# Patient Record
Sex: Female | Born: 1960 | Race: Black or African American | Hispanic: No | Smoking: Never smoker
Health system: Southern US, Community
[De-identification: ages and names within clinical notes are randomized; demographics above are authoritative.]

## PROBLEM LIST (undated history)

## (undated) DIAGNOSIS — A4902 Methicillin resistant Staphylococcus aureus infection, unspecified site: Secondary | ICD-10-CM

## (undated) DIAGNOSIS — I10 Essential (primary) hypertension: Secondary | ICD-10-CM

## (undated) DIAGNOSIS — M1990 Unspecified osteoarthritis, unspecified site: Secondary | ICD-10-CM

## (undated) HISTORY — DX: Unspecified osteoarthritis, unspecified site: M19.90

## (undated) HISTORY — PX: REPLACEMENT TOTAL KNEE: SUR1224

## (undated) HISTORY — DX: Methicillin resistant Staphylococcus aureus infection, unspecified site: A49.02

---

## 2006-07-01 ENCOUNTER — Encounter: Admission: RE | Admit: 2006-07-01 | Discharge: 2006-07-01 | Payer: Self-pay | Admitting: Obstetrics & Gynecology

## 2007-08-18 ENCOUNTER — Encounter: Admission: RE | Admit: 2007-08-18 | Discharge: 2007-08-18 | Payer: Self-pay | Admitting: Obstetrics & Gynecology

## 2012-01-05 ENCOUNTER — Encounter (HOSPITAL_COMMUNITY): Payer: Self-pay

## 2012-01-05 ENCOUNTER — Emergency Department (HOSPITAL_COMMUNITY): Payer: Self-pay

## 2012-01-05 ENCOUNTER — Emergency Department (HOSPITAL_COMMUNITY)
Admission: EM | Admit: 2012-01-05 | Discharge: 2012-01-05 | Disposition: A | Discharge status: Home / Self Care | Payer: Self-pay | Attending: Emergency Medicine | Admitting: Emergency Medicine

## 2012-01-05 DIAGNOSIS — M7989 Other specified soft tissue disorders: Secondary | ICD-10-CM | POA: Insufficient documentation

## 2012-01-05 DIAGNOSIS — R609 Edema, unspecified: Secondary | ICD-10-CM | POA: Insufficient documentation

## 2012-01-05 DIAGNOSIS — R1032 Left lower quadrant pain: Secondary | ICD-10-CM | POA: Insufficient documentation

## 2012-01-05 LAB — COMPREHENSIVE METABOLIC PANEL
Albumin: 3.9 g/dL (ref 3.5–5.2)
BUN: 11 mg/dL (ref 6–23)
CO2: 28 mEq/L (ref 19–32)
Chloride: 103 mEq/L (ref 96–112)
GFR calc non Af Amer: >90 mL/min (ref >90)
Potassium: 4.1 mEq/L (ref 3.5–5.1)

## 2012-01-05 LAB — URINALYSIS, ROUTINE W REFLEX MICROSCOPIC
Bilirubin Urine: NEGATIVE
Glucose, UA: NEGATIVE mg/dL
Protein, ur: NEGATIVE mg/dL

## 2012-01-05 LAB — DIFFERENTIAL
Basophils Relative: 0 % (ref 0–1)
Eosinophils Relative: 1 % (ref 0–5)
Monocytes Relative: 6 % (ref 3–12)

## 2012-01-05 LAB — CBC
HCT: 36.3 % (ref 36.0–46.0)
Hemoglobin: 11.9 g/dL — ABNORMAL LOW (ref 12.0–15.0)
MCH: 29 pg (ref 26.0–34.0)
RBC: 4.11 MIL/uL (ref 3.87–5.11)
WBC: 12.6 10*3/uL — ABNORMAL HIGH (ref 4.0–10.5)

## 2012-01-05 LAB — URINE MICROSCOPIC-ADD ON

## 2012-01-05 LAB — PREGNANCY, URINE: Preg Test, Ur: NEGATIVE

## 2012-01-05 MED ORDER — PROMETHAZINE HCL 25 MG PO TABS: 25.0000 mg | ORAL_TABLET | Freq: Four times a day (QID) | ORAL | Status: DC | PRN

## 2012-01-05 MED ORDER — KETOROLAC TROMETHAMINE 60 MG/2ML IM SOLN
60.0000 mg | Freq: Once | INTRAMUSCULAR | Status: AC
  Administered 2012-01-05: 60 mg via INTRAMUSCULAR
  Filled 2012-01-05: qty 2

## 2012-01-05 MED ORDER — OXYCODONE-ACETAMINOPHEN 5-325 MG PO TABS: 1.0000 | ORAL_TABLET | Freq: Four times a day (QID) | ORAL | Status: AC | PRN

## 2012-01-05 MED ORDER — NITROFURANTOIN MONOHYD MACRO 100 MG PO CAPS: 100.0000 mg | ORAL_CAPSULE | Freq: Two times a day (BID) | ORAL | Status: AC

## 2012-01-05 NOTE — Discharge Instructions (Signed)
Abdominal Pain Many things can cause belly (abdominal) pain. Most times, the belly pain is not dangerous. The amount of belly pain does not tell how serious the problem may be. Many cases of belly pain can be watched and treated at home. HOME CARE   Do not take medicines that help you go poop (laxatives) unless told to by your doctor.   Only take medicine as told by your doctor.   Eat or drink as told by your doctor. Your doctor will tell you if you should be on a special diet.  GET HELP RIGHT AWAY IF:   The pain does not go away.   You have a fever.   You keep throwing up (vomiting).   The pain changes and is only in the right or left part of the belly.   You have bloody or tarry looking poop.  MAKE SURE YOU:   Understand these instructions.   Will watch your condition.   Will get help right away if you are not doing well or get worse.  Document Released: 01/26/2008 Document Revised: 07/29/2011 Document Reviewed: 08/25/2009 [facility] Patient Information 2012 [place], [place].  Medications for pain, nausea, antibiotic for urinary tract infection.  Increase fluids. Ultrasound was normal. Try to get followup in your home area

## 2012-01-05 NOTE — ED Notes (Signed)
Complain of pain in left lower abd for a month. Been eval by twice elsewhere for same

## 2012-01-05 NOTE — ED Provider Notes (Signed)
This chart was scribed for Terry Hutching, MD by Williemae Natter. The patient was seen in room APA03/APA03 at 3:00 PM.  History     CSN: [id number]  Arrival date & time 01/05/12  1149   First MD Initiated Contact with Patient 01/05/12 1442      Chief Complaint  Patient presents with  . Abdominal Pain    (Consider location/radiation/quality/duration/timing/severity/associated sxs/prior treatment) HPI Terry Washington is a 51 y.o. female who presents to the Emergency Department complaining of pain in LLQ for 1 month intermittently.  Pain worsens when standing up and walking and better with sitting. No dysuria, no diarrhea, no vomiting. Pt had 3 CT scans done by different doctors in 1 month. Scans showed cyst on her liver. No fever, chills, weight loss.  No radiation of pain. Symptoms are mild to moderate. History reviewed. No pertinent past medical history.  History reviewed. No pertinent past surgical history.  No family history on file.  History  Substance Use Topics  . Smoking status: Not on file  . Smokeless tobacco: Not on file  . Alcohol Use: No    OB History    Grav Para Term Preterm Abortions TAB SAB Ect Mult Living                  Review of Systems  Cardiovascular: Positive for leg swelling.  Gastrointestinal: Positive for abdominal pain.  All other systems reviewed and are negative.    Allergies  Review of patient's allergies indicates no known allergies.  Home Medications  No current outpatient prescriptions on file.  BP 145/74  Pulse 86  Temp(Src) 98 F (36.7 C) (Oral)  Resp 18  Ht 5\' 2"  (1.575 m)  Wt 226 lb (102.513 kg)  BMI 41.34 kg/m2  SpO2 100%  LMP 01/02/2012  Physical Exam  Nursing note and vitals reviewed. Constitutional: She is oriented to person, place, and time. She appears well-developed and well-nourished.  HENT:  Head: Normocephalic and atraumatic.  Eyes: EOM are normal. Pupils are equal, round, and reactive to light.  Neck: Normal  range of motion. Neck supple.  Pulmonary/Chest: Effort normal. No respiratory distress.  Abdominal: There is tenderness (LLQ tenderness).  Musculoskeletal: She exhibits edema (1+ bilateral lower extremity edema).  Neurological: She is alert and oriented to person, place, and time. She exhibits normal muscle tone.  Skin: Skin is warm and dry.  Psychiatric: She has a normal mood and affect. Her behavior is normal.    ED Course  Procedures (including critical care time) DIAGNOSTIC STUDIES: Oxygen Saturation is 100% on room air, normal by my interpretation.    COORDINATION OF CARE: Medications - No data to display Results for orders placed during the hospital encounter of 01/05/12  CBC      Component Value Range   WBC 12.6 (*) 4.0 - 10.5 (K/uL)   RBC 4.11  3.87 - 5.11 (MIL/uL)   Hemoglobin 11.9 (*) 12.0 - 15.0 (g/dL)   HCT 13.0  86.5 - 78.4 (%)   MCV 88.3  78.0 - 100.0 (fL)   MCH 29.0  26.0 - 34.0 (pg)   MCHC 32.8  30.0 - 36.0 (g/dL)   RDW 69.6  29.5 - 28.4 (%)   Platelets 507 (*) 150 - 400 (K/uL)  DIFFERENTIAL      Component Value Range   Neutrophils Relative 75  43 - 77 (%)   Neutro Abs 9.4 (*) 1.7 - 7.7 (K/uL)   Lymphocytes Relative 19  12 - 46 (%)  Lymphs Abs 2.4  0.7 - 4.0 (K/uL)   Monocytes Relative 6  3 - 12 (%)   Monocytes Absolute 0.7  0.1 - 1.0 (K/uL)   Eosinophils Relative 1  0 - 5 (%)   Eosinophils Absolute 0.1  0.0 - 0.7 (K/uL)   Basophils Relative 0  0 - 1 (%)   Basophils Absolute 0.0  0.0 - 0.1 (K/uL)  COMPREHENSIVE METABOLIC PANEL      Component Value Range   Sodium 140  135 - 145 (mEq/L)   Potassium 4.1  3.5 - 5.1 (mEq/L)   Chloride 103  96 - 112 (mEq/L)   CO2 28  19 - 32 (mEq/L)   Glucose, Bld 102 (*) 70 - 99 (mg/dL)   BUN 11  6 - 23 (mg/dL)   Creatinine, Ser 1.61  0.50 - 1.10 (mg/dL)   Calcium 9.8  8.4 - 09.6 (mg/dL)   Total Protein 8.2  6.0 - 8.3 (g/dL)   Albumin 3.9  3.5 - 5.2 (g/dL)   AST 15  0 - 37 (U/L)   ALT 12  0 - 35 (U/L)   Alkaline  Phosphatase 81  39 - 117 (U/L)   Total Bilirubin 0.2 (*) 0.3 - 1.2 (mg/dL)   GFR calc non Af Amer >90  >90 (mL/min)   GFR calc Af Amer >90  >90 (mL/min)  URINALYSIS, ROUTINE W REFLEX MICROSCOPIC      Component Value Range   Color, Urine YELLOW  YELLOW    APPearance CLEAR  CLEAR    Specific Gravity, Urine 1.025  1.005 - 1.030    pH 5.5  5.0 - 8.0    Glucose, UA NEGATIVE  NEGATIVE (mg/dL)   Hgb urine dipstick TRACE (*) NEGATIVE    Bilirubin Urine NEGATIVE  NEGATIVE    Ketones, ur NEGATIVE  NEGATIVE (mg/dL)   Protein, ur NEGATIVE  NEGATIVE (mg/dL)   Urobilinogen, UA 0.2  0.0 - 1.0 (mg/dL)   Nitrite NEGATIVE  NEGATIVE    Leukocytes, UA SMALL (*) NEGATIVE   PREGNANCY, URINE      Component Value Range   Preg Test, Ur NEGATIVE  NEGATIVE   URINE MICROSCOPIC-ADD ON      Component Value Range   Squamous Epithelial / LPF MANY (*) RARE    WBC, UA 7-10  <3 (WBC/hpf)   RBC / HPF 7-10  <3 (RBC/hpf)   Bacteria, UA FEW (*) RARE    Urine-Other YEAST      US Abdomen Complete  01/05/2012  *RADIOLOGY REPORT*  Clinical Data:  Left lower quadrant pain.  COMPLETE ABDOMINAL ULTRASOUND  Comparison:  None.  Findings:  Gallbladder:  No shadowing gallstones or echogenic sludge.  No gallbladder wall thickening or pericholecystic fluid.  No sonographic Murphy's sign according to the ultrasound technologist. Wall thickness is 3.2 mm, within normal limits.  Common bile duct:  Normal in caliber. No biliary ductal dilation. The maximal diameters 3.3 mm, within normal limits.  Liver:  No focal lesion identified.  Within normal limits in parenchymal echogenicity.  IVC:  Appears normal.  Pancreas:  No focal abnormality seen.  Spleen:  Normal size and echotexture without focal parenchymal abnormality.  The maximal length is 7.1 cm, within normal limits.  Right Kidney:  No hydronephrosis.  Well-preserved cortex.  Normal size and parenchymal echotexture without focal abnormalities.  The maximal length is 9.3 cm.  Left Kidney:   No hydronephrosis.  Well-preserved cortex.  Normal size and parenchymal echotexture without focal abnormalities.  The maximal length  is 9.1 cm.  Abdominal aorta:  No aneurysm identified.  IMPRESSION: Negative abdominal ultrasound.  Original Report Authenticated By: Jamesetta Orleans. MATTERN, M.D.   Labs Reviewed - No data to display No results found.   No diagnosis found.    MDM  Patient has had 3 negative CT scans in the past month. She has no acute abdomen today. Vital signs are normal. Ultrasound negative. Urinalysis shows 7-10 white cells. Will treat for urinary tract infection.  Test results discussed in detail with patient and her friend I personally performed the services described in this documentation, which was scribed in my presence. The recorded information has been reviewed and considered.         Terry Hutching, MD 01/05/12 [phone]

## 2012-01-05 NOTE — ED Notes (Signed)
Pt presents with left sided mid abdominal pain x 1 month. Pt states has been seen in the Ed for same c/o without relief. Pt states it hurts to walk, making her double over. ABD soft non-distened with positive bowel sounds. Pt states has regular bowel movements.  Pt was treated for UTI, reports finishing all medication.

## 2012-12-12 IMAGING — US US ABDOMEN COMPLETE
1 series · 14 of 25 positions shown · non-contrast
Comparison: None.

CLINICAL DATA: Left lower quadrant pain.

COMPLETE ABDOMINAL ULTRASOUND

[Series 1: us abdomen complete · 0.22mm/px · 14 of 89 slices shown]
[im 1/89]
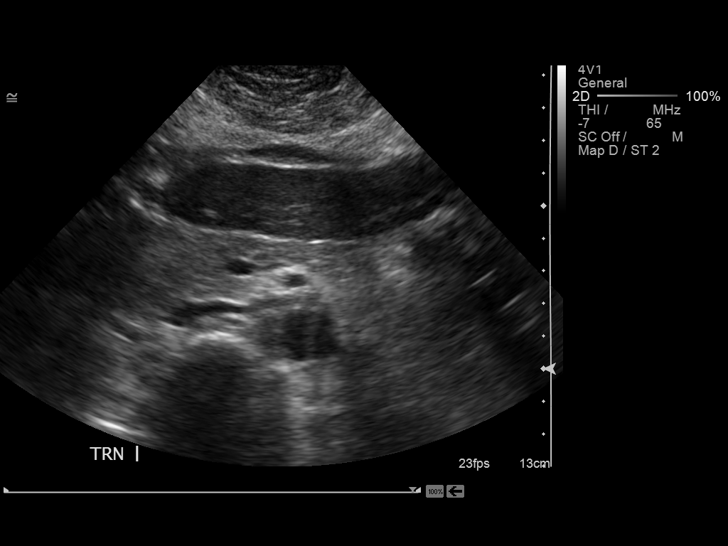
[im 8/89]
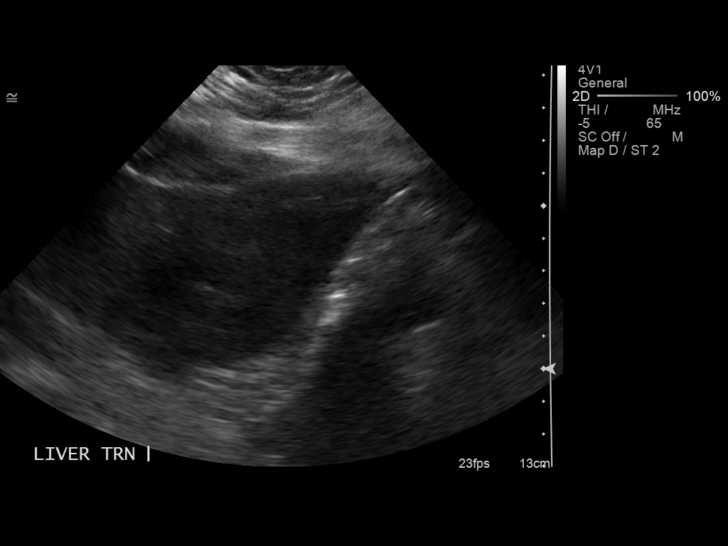
[im 15/89]
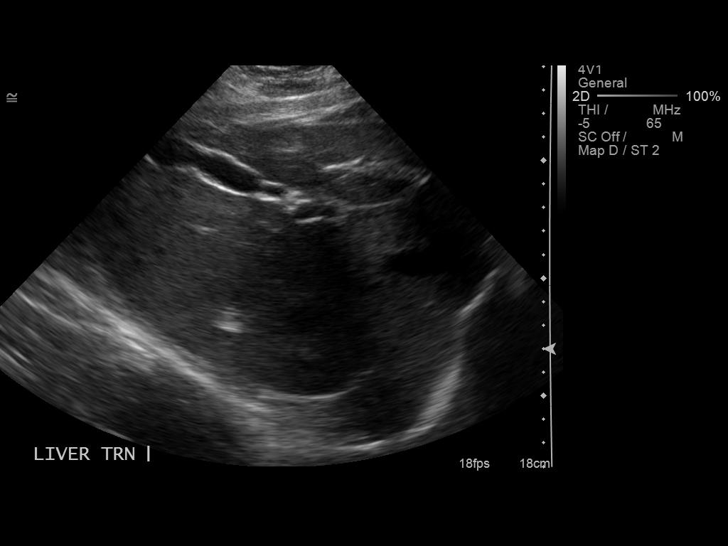
[im 23/89]
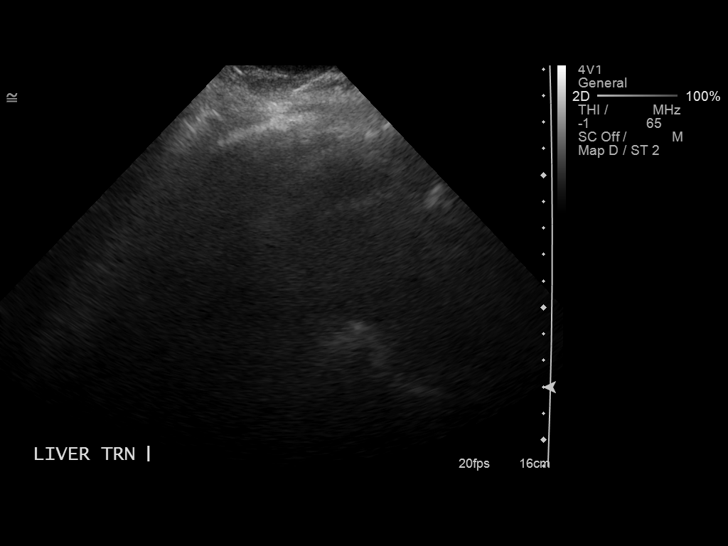
[im 30/89]
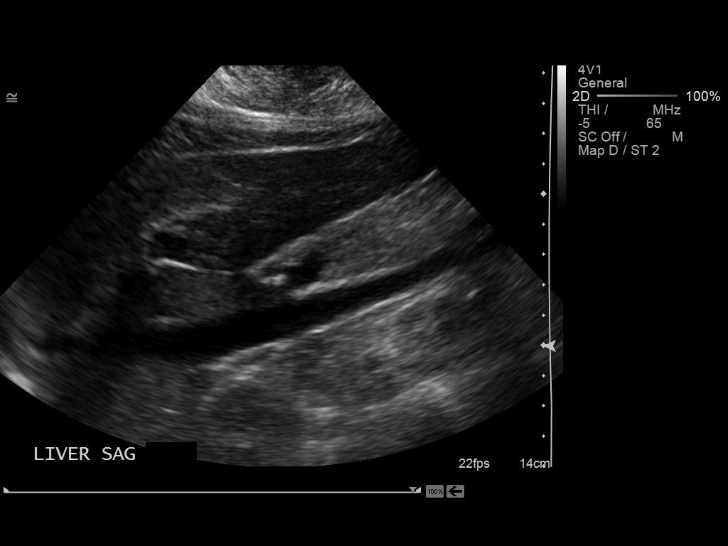
[im 34/89]
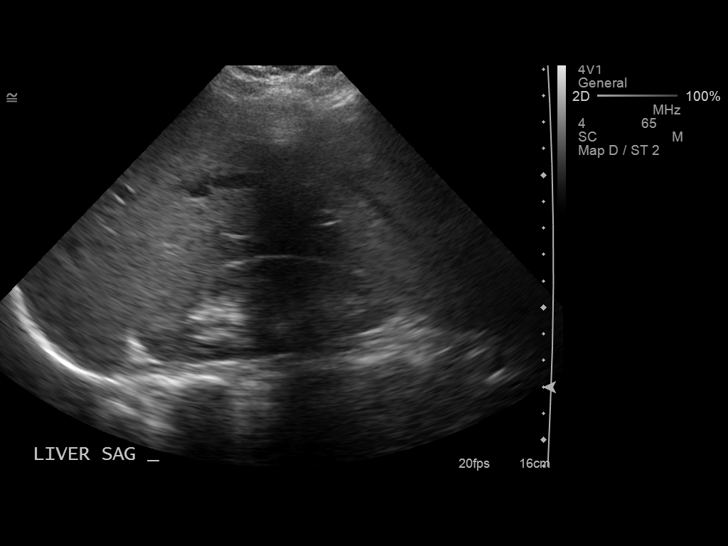
[im 41/89]
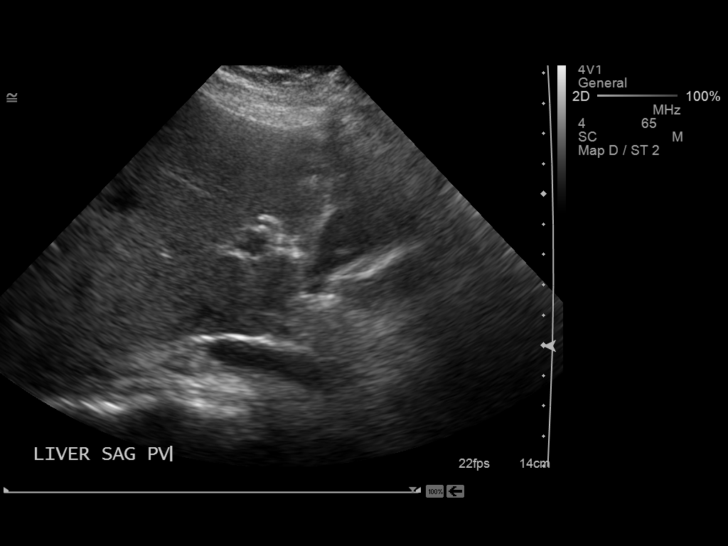
[im 48/89]
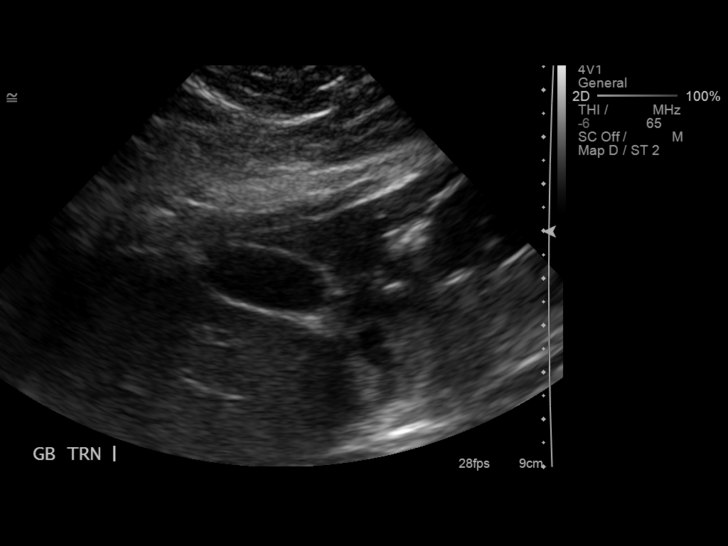
[im 56/89]
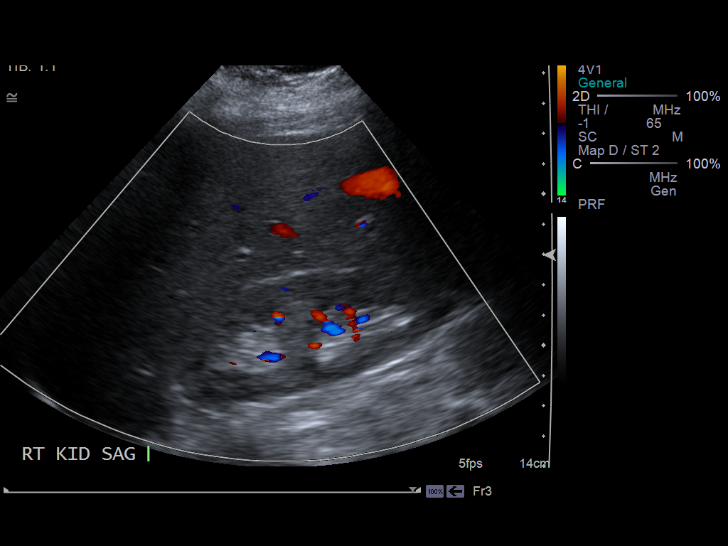
[im 59/89]
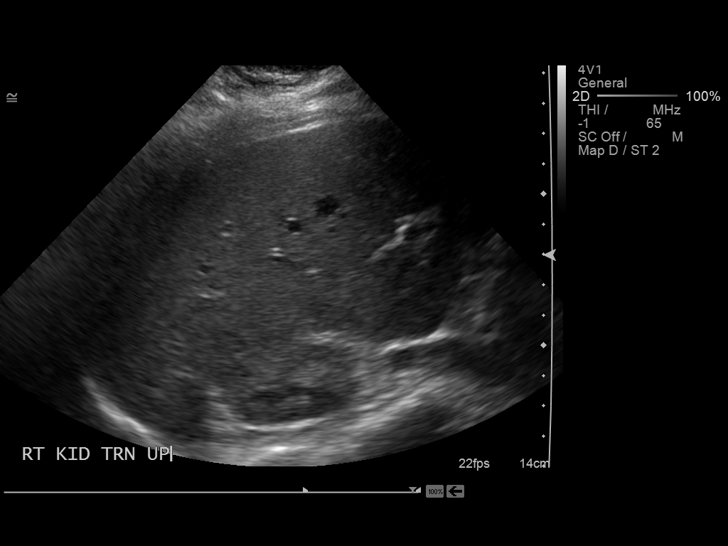
[im 67/89]
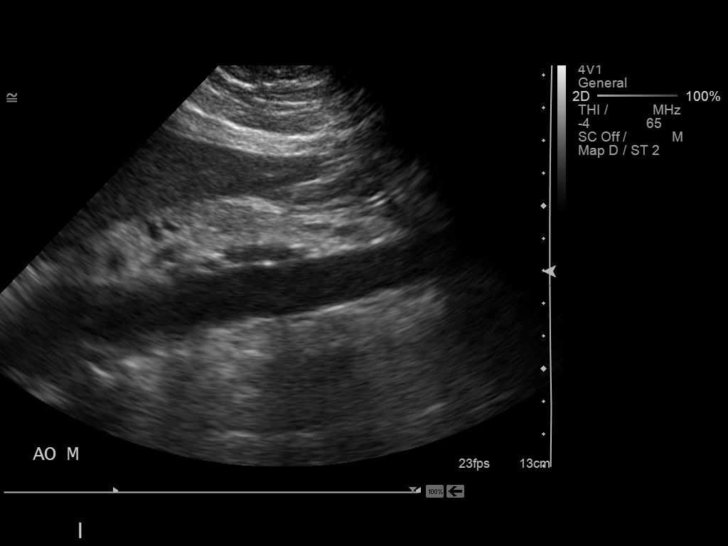
[im 74/89]
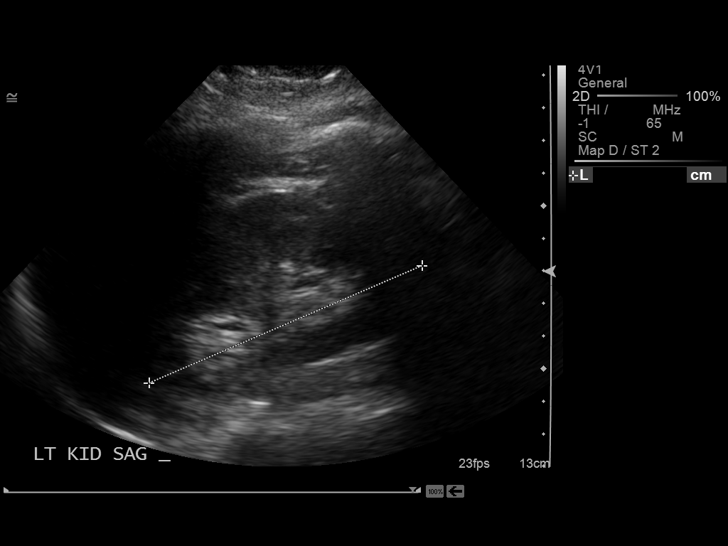
[im 81/89]
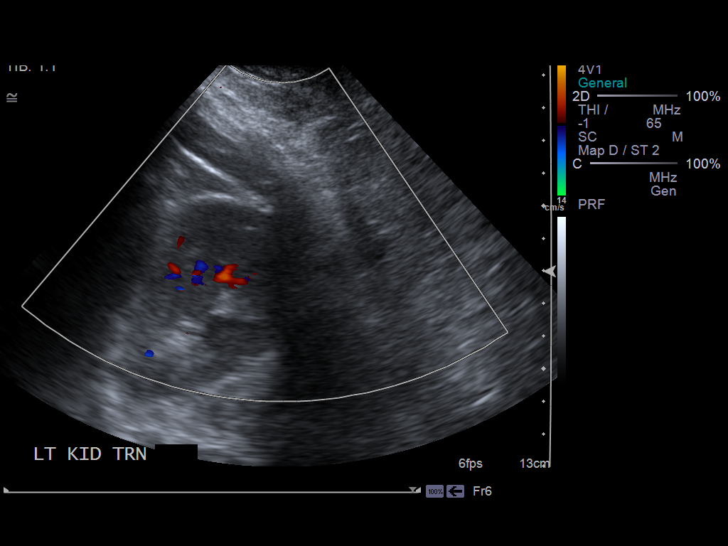
[im 89/89]
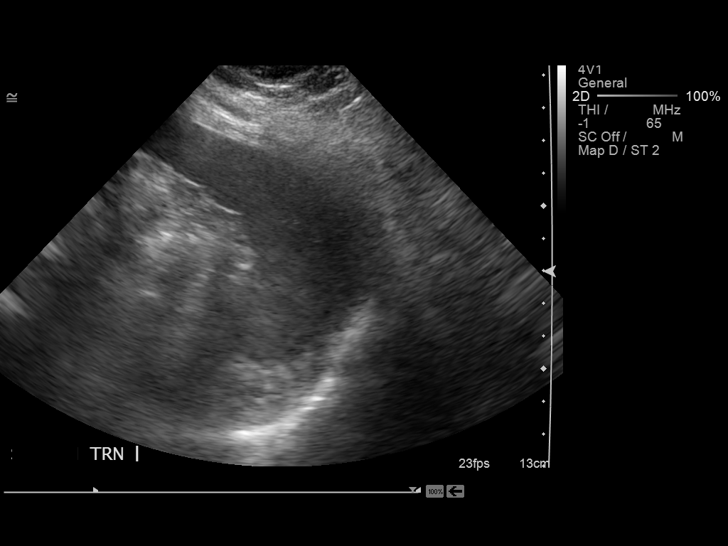

[14 of 25 positions shown; findings below may reference images not displayed]

FINDINGS: Gallbladder:  No shadowing gallstones or echogenic sludge.  No
gallbladder wall thickening or pericholecystic fluid.  No
sonographic Murphy's sign according to the ultrasound technologist.
Wall thickness is 3.2 mm, within normal limits.

Common bile duct:  Normal in caliber. No biliary ductal dilation.
The maximal diameters 3.3 mm, within normal limits.

Liver:  No focal lesion identified.  Within normal limits in
parenchymal echogenicity.

IVC:  Appears normal.

Pancreas:  No focal abnormality seen.

Spleen:  Normal size and echotexture without focal parenchymal
abnormality.  The maximal length is 7.1 cm, within normal limits.

Right Kidney:  No hydronephrosis.  Well-preserved cortex.  Normal
size and parenchymal echotexture without focal abnormalities.  The
maximal length is 9.3 cm.

Left Kidney:  No hydronephrosis.  Well-preserved cortex.  Normal
size and parenchymal echotexture without focal abnormalities.  The
maximal length is 9.1 cm.

Abdominal aorta:  No aneurysm identified.
IMPRESSION: Negative abdominal ultrasound.

## 2017-01-25 ENCOUNTER — Ambulatory Visit: Payer: Self-pay | Admitting: Obstetrics

## 2021-04-11 ENCOUNTER — Other Ambulatory Visit: Payer: Self-pay

## 2021-04-11 ENCOUNTER — Encounter (HOSPITAL_COMMUNITY): Payer: Self-pay | Admitting: Emergency Medicine

## 2021-04-11 ENCOUNTER — Emergency Department (HOSPITAL_COMMUNITY)
Admission: EM | Admit: 2021-04-11 | Discharge: 2021-04-11 | Disposition: A | Discharge status: Home / Self Care | Payer: BC Managed Care – PPO | Attending: Emergency Medicine | Admitting: Emergency Medicine

## 2021-04-11 DIAGNOSIS — R1032 Left lower quadrant pain: Secondary | ICD-10-CM | POA: Insufficient documentation

## 2021-04-11 DIAGNOSIS — Z79899 Other long term (current) drug therapy: Secondary | ICD-10-CM | POA: Diagnosis not present

## 2021-04-11 DIAGNOSIS — I10 Essential (primary) hypertension: Secondary | ICD-10-CM | POA: Insufficient documentation

## 2021-04-11 HISTORY — DX: Essential (primary) hypertension: I10

## 2021-04-11 LAB — CBC WITH DIFFERENTIAL/PLATELET
Abs Immature Granulocytes: 0.04 10*3/uL (ref 0.00–0.07)
Basophils Absolute: 0 10*3/uL (ref 0.0–0.1)
Basophils Relative: 0 %
Eosinophils Absolute: 0.4 10*3/uL (ref 0.0–0.5)
Eosinophils Relative: 5 %
HCT: 36.1 % (ref 36.0–46.0)
Hemoglobin: 11.5 g/dL — ABNORMAL LOW (ref 12.0–15.0)
Immature Granulocytes: 1 %
Lymphocytes Relative: 27 %
Lymphs Abs: 2.2 10*3/uL (ref 0.7–4.0)
MCH: 29.3 pg (ref 26.0–34.0)
MCHC: 31.9 g/dL (ref 30.0–36.0)
MCV: 92.1 fL (ref 80.0–100.0)
Monocytes Absolute: 0.7 10*3/uL (ref 0.1–1.0)
Monocytes Relative: 8 %
Neutro Abs: 4.9 10*3/uL (ref 1.7–7.7)
Neutrophils Relative %: 59 %
Platelets: 529 10*3/uL — ABNORMAL HIGH (ref 150–400)
RBC: 3.92 MIL/uL (ref 3.87–5.11)
RDW: 15.3 % (ref 11.5–15.5)
WBC: 8.3 10*3/uL (ref 4.0–10.5)
nRBC: 0 % (ref 0.0–0.2)

## 2021-04-11 LAB — URINALYSIS, ROUTINE W REFLEX MICROSCOPIC
Glucose, UA: NEGATIVE mg/dL
Hgb urine dipstick: NEGATIVE
Ketones, ur: NEGATIVE mg/dL
Leukocytes,Ua: NEGATIVE
Nitrite: NEGATIVE
Protein, ur: NEGATIVE mg/dL
Specific Gravity, Urine: 1.034 — ABNORMAL HIGH (ref 1.005–1.030)
pH: 5 (ref 5.0–8.0)

## 2021-04-11 LAB — COMPREHENSIVE METABOLIC PANEL
ALT: 20 U/L (ref 0–44)
AST: 21 U/L (ref 15–41)
Albumin: 3.6 g/dL (ref 3.5–5.0)
Alkaline Phosphatase: 63 U/L (ref 38–126)
Anion gap: 7 (ref 5–15)
BUN: 22 mg/dL — ABNORMAL HIGH (ref 6–20)
CO2: 27 mmol/L (ref 22–32)
Calcium: 9.3 mg/dL (ref 8.9–10.3)
Chloride: 108 mmol/L (ref 98–111)
Creatinine, Ser: 1.08 mg/dL — ABNORMAL HIGH (ref 0.44–1.00)
GFR, Estimated: 59 mL/min — ABNORMAL LOW (ref >60)
Glucose, Bld: 103 mg/dL — ABNORMAL HIGH (ref 70–99)
Potassium: 4.1 mmol/L (ref 3.5–5.1)
Sodium: 142 mmol/L (ref 135–145)
Total Bilirubin: 0.8 mg/dL (ref 0.3–1.2)
Total Protein: 6.7 g/dL (ref 6.5–8.1)

## 2021-04-11 LAB — LIPASE, BLOOD: Lipase: 41 U/L (ref 11–51)

## 2021-04-11 MED ORDER — DICLOFENAC SODIUM 1 % EX GEL: 2.0000 g | Freq: Four times a day (QID) | CUTANEOUS | 0 refills | Status: AC

## 2021-04-11 NOTE — ED Notes (Signed)
Peripheral IV removed by this RN

## 2021-04-11 NOTE — ED Notes (Signed)
E-signature pad unavailable at time of pt discharge. This RN discussed discharge materials with pt and answered all pt questions. Pt stated understanding of discharge material. ? ?

## 2021-04-11 NOTE — ED Triage Notes (Signed)
Pt c/o left sided abdominal pain and low back pain. Denies nausea/vomiting/diarrhea, no urinary symptoms/abnormal discharge.

## 2021-04-11 NOTE — ED Provider Notes (Signed)
[facility] EMERGENCY DEPARTMENT Provider Note   CSN: [id number] Arrival date & time: 04/11/21  1455     History Chief Complaint  Patient presents with   Abdominal Pain    Terry Washington is a 60 y.o. female.  60 year old female with history of hypertension presents with complaint of left lower quadrant abdominal pain which radiates to her left lower back x3 weeks.  Patient states that the pain only occurs after she has been standing at work for about 3 hours, does not have any pain at this time.  Pain is described as someone punching her.  Patient has been taking ibuprofen without improvement in her pain.  Patient has been her PCP as well as the ER in [place] twice for same, has had a urine culture which she states had no growth as well as a CT scan which she was told everything was normal.  She has been told that she has microscopic blood in her urine.  Patient states that she had a similar episode 9 years ago when she was diagnosed with a UTI.  She denies any dysuria, frequency, urgency. Denies pain with movement, fevers, chills, nausea, vomiting, changes in bowel habits. Patient has never had a colonoscopy, denies unintentional weight loss. No other complaints or concerns.       Past Medical History:  Diagnosis Date   Hypertension     There are no problems to display for this patient.   No past surgical history on file.   OB History   No obstetric history on file.     No family history on file.  Social History   Substance Use Topics   Alcohol use: No   Drug use: No    Home Medications Prior to Admission medications   Medication Sig Start Date End Date Taking? Authorizing Provider  acetaminophen (TYLENOL) 500 MG tablet Take 1,000 mg by mouth every 6 (six) hours as needed.   Yes [provider]  Ascorbic Acid (VITAMIN C) 100 MG CHEW Chew 1 tablet by mouth daily.   Yes [provider]  b complex vitamins capsule Take 1  capsule by mouth daily.   Yes [provider]  buPROPion (WELLBUTRIN SR) 150 MG 12 hr tablet Take 150 mg by mouth 2 (two) times daily. 03/28/21  Yes [provider]  Cholecalciferol 25 MCG (1000 UT) tablet Take 1,000 Units by mouth daily.   Yes [provider]  diclofenac Sodium (VOLTAREN) 1 % GEL Apply 2 g topically 4 (four) times daily. 04/11/21  Yes Jeannie Fend, PA-C  etodolac (LODINE) 500 MG tablet Take 500 mg by mouth in the morning and at bedtime. 03/31/20  Yes [provider]  Ginger, Zingiber officinalis, (GINGER ROOT PO) Take 1 tablet by mouth daily.   Yes [provider]  ibuprofen (ADVIL,MOTRIN) 800 MG tablet Take 800 mg by mouth 3 (three) times daily.   Yes [provider]  lisinopril (ZESTRIL) 20 MG tablet Take 20 mg by mouth daily. 03/23/21  Yes [provider]  Omega-3 Fatty Acids (FISH OIL) 1000 MG CAPS Take 1 capsule by mouth daily.   Yes [provider]  Turmeric (QC TUMERIC COMPLEX PO) Take 1 tablet by mouth daily.   Yes [provider]  promethazine (PHENERGAN) 25 MG tablet Take 1 tablet (25 mg total) by mouth every 6 (six) hours as needed for nausea. 01/05/12 01/12/12  Donnetta Hutching, MD    Allergies    Other  Review  of Systems   Review of Systems  Constitutional:  Negative for chills, diaphoresis, fatigue, fever and unexpected weight change.  Respiratory:  Negative for shortness of breath.   Cardiovascular:  Negative for chest pain.  Gastrointestinal:  Positive for abdominal pain. Negative for abdominal distention, blood in stool, constipation, diarrhea, nausea and vomiting.  Genitourinary:  Negative for dysuria, frequency and urgency.  Musculoskeletal:  Positive for back pain.  Skin:  Negative for rash and wound.  Allergic/Immunologic: Negative for immunocompromised state.  Neurological:  Negative for weakness and numbness.  Psychiatric/Behavioral:  Negative for confusion.   All other systems  reviewed and are negative.  Physical Exam Updated Vital Signs BP 112/62   Pulse 79   Temp 98.8 F (37.1 C) (Oral)   Resp 20   SpO2 100%   Physical Exam Vitals and nursing note reviewed.  Constitutional:      General: She is not in acute distress.    Appearance: She is well-developed. She is not diaphoretic.  HENT:     Head: Normocephalic and atraumatic.  Cardiovascular:     Rate and Rhythm: Normal rate and regular rhythm.     Heart sounds: Normal heart sounds. No murmur heard. Pulmonary:     Effort: Pulmonary effort is normal.     Breath sounds: Normal breath sounds.  Abdominal:     Palpations: Abdomen is soft.     Tenderness: There is no abdominal tenderness. There is no right CVA tenderness or left CVA tenderness.  Musculoskeletal:     Thoracic back: No tenderness or bony tenderness.     Lumbar back: No tenderness or bony tenderness.  Skin:    General: Skin is warm and dry.     Findings: No erythema or rash.  Neurological:     Mental Status: She is alert and oriented to person, place, and time.  Psychiatric:        Behavior: Behavior normal.    ED Results / Procedures / Treatments   Labs (all labs ordered are listed, but only abnormal results are displayed) Labs Reviewed  CBC WITH DIFFERENTIAL/PLATELET - Abnormal; Notable for the following components:      Result Value   Hemoglobin 11.5 (*)    Platelets 529 (*)    All other components within normal limits  COMPREHENSIVE METABOLIC PANEL - Abnormal; Notable for the following components:   Glucose, Bld 103 (*)    BUN 22 (*)    Creatinine, Ser 1.08 (*)    GFR, Estimated 59 (*)    All other components within normal limits  URINALYSIS, ROUTINE W REFLEX MICROSCOPIC - Abnormal; Notable for the following components:   APPearance HAZY (*)    Specific Gravity, Urine 1.034 (*)    Bilirubin Urine SMALL (*)    All other components within normal limits  LIPASE, BLOOD    EKG None  Radiology No results  found.  Procedures Procedures   Medications Ordered in ED Medications - No data to display  ED Course  I have reviewed the triage vital signs and the nursing notes.  Pertinent labs & imaging results that were available during my care of the patient were reviewed by me and considered in my medical decision making (see chart for details).  Clinical Course as of 04/11/21 1932  Sat Apr 11, 2021  2969 61 year old female with complaint of left lower abdominal pain which radiates to her back ongoing for the past 3 weeks, only occurs when she has been standing at work for 3  hours or more.  Not in any pain at this time.  The patient has been to the local emergency room in [place] twice, tells me that her CT scan was normal.  Has been to her PCP and told that her urine culture did not have any growth.  Patient feels like her symptoms are similar to 9 years ago when she was diagnosed with a bladder infection on CT scan.  She does not have any urinary symptoms. No pain with palpation of the back, no abdominal tenderness on exam. Attempted to obtain records from prior ER visit without success.  Labs without significant findings including CBC, CMP, lipase, urinalysis does not indicate UTI today. Discussed with patient possibility of musculoskeletal pain as symptoms only occur when she has been standing for 3 hours.  She is been taking ibuprofen without improvement, advised to try Voltaren.  Recommend that she follow-up with her primary care provider for further work-up. [LM]    Clinical Course User Index [LM] Alden Hipp   MDM Rules/Calculators/A&P                           Final Clinical Impression(s) / ED Diagnoses Final diagnoses:  Left lower quadrant abdominal pain    Rx / DC Orders ED Discharge Orders          Ordered    diclofenac Sodium (VOLTAREN) 1 % GEL  4 times daily        04/11/21 1907             Alden Hipp 04/11/21 1932    Wynetta Fines,  MD 04/15/21 [phone]

## 2021-04-11 NOTE — Discharge Instructions (Addendum)
Your work up today is reassuring. Recommend recheck with your doctor for further work up/evaluation. Try applying Voltaren Gel to left lower back area. Symptoms may be related to musculoskeletal pain and this may help.

## 2021-04-11 NOTE — ED Notes (Signed)
EDP discussed and addressed pt concerns per d/c information

## 2022-06-02 ENCOUNTER — Ambulatory Visit: Payer: BC Managed Care – PPO | Admitting: Obstetrics and Gynecology

## 2022-06-14 ENCOUNTER — Encounter: Payer: Self-pay | Admitting: *Deleted

## 2022-06-15 ENCOUNTER — Ambulatory Visit (INDEPENDENT_AMBULATORY_CARE_PROVIDER_SITE_OTHER): Payer: BC Managed Care – PPO | Admitting: Obstetrics and Gynecology

## 2022-06-15 ENCOUNTER — Encounter: Payer: Self-pay | Admitting: Obstetrics and Gynecology

## 2022-06-15 VITALS — BP 147/72 | HR 88 | Ht 60.0 in | Wt 233.0 lb

## 2022-06-15 DIAGNOSIS — N812 Incomplete uterovaginal prolapse: Secondary | ICD-10-CM | POA: Diagnosis not present

## 2022-06-15 DIAGNOSIS — N3281 Overactive bladder: Secondary | ICD-10-CM

## 2022-06-15 DIAGNOSIS — R159 Full incontinence of feces: Secondary | ICD-10-CM

## 2022-06-15 DIAGNOSIS — R350 Frequency of micturition: Secondary | ICD-10-CM

## 2022-06-15 DIAGNOSIS — N8110 Cystocele, unspecified: Secondary | ICD-10-CM

## 2022-06-15 LAB — POCT URINALYSIS DIPSTICK
Blood, UA: NEGATIVE
Glucose, UA: NEGATIVE
Ketones, UA: NEGATIVE
Leukocytes, UA: NEGATIVE
Nitrite, UA: NEGATIVE
Protein, UA: NEGATIVE
Spec Grav, UA: >=1.03 — AB (ref 1.010–1.025)
Urobilinogen, UA: 0.2 E.U./dL
pH, UA: 6 (ref 5.0–8.0)

## 2022-06-15 MED ORDER — SOLIFENACIN SUCCINATE 10 MG PO TABS: 10.0000 mg | ORAL_TABLET | Freq: Every day | ORAL | 5 refills | Status: DC

## 2022-06-15 MED ORDER — SOLIFENACIN SUCCINATE 10 MG PO TABS: 10.0000 mg | ORAL_TABLET | Freq: Every day | ORAL | 5 refills | Status: AC

## 2022-06-15 NOTE — Patient Instructions (Addendum)
Accidental Bowel Leakage: Our goal is to achieve formed bowel movements daily or every-other-day without leakage.  You may need to try different combinations of the following options to find what works best for you.  Some management options include: Dietary changes (more leafy greens, vegetables and fruits; less processed foods) Fiber supplementation (Metamucil or something with psyllium as active ingredient) Over-the-counter imodium (tablets or liquid) to help solidify the stool and prevent leakage of stool.   Today we talked about ways to manage bladder urgency such as altering your diet to avoid irritative beverages and foods (bladder diet) as well as attempting to decrease stress and other exacerbating factors.    The Most Bothersome Foods* The Least Bothersome Foods*  Coffee - Regular & Decaf Tea - caffeinated Carbonated beverages - cola, non-colas, diet & caffeine-free Alcohols - Beer, Red Wine, White Wine, Champagne Fruits - Grapefruit, [place], Orange, [employer] - Cranberry, Grapefruit, Orange, Pineapple Vegetables - Tomato & Tomato Products Flavor Enhancers - Hot peppers, Spicy foods, Chili, Horseradish, Vinegar, Monosodium glutamate (MSG) Artificial Sweeteners - NutraSweet, Sweet 'N Low, Equal (sweetener), Saccharin Ethnic foods - [place], [place], [place] food [employer] - low-fat & whole Fruits - Bananas, Blueberries, Honeydew melon, Pears, Raisins, Watermelon Vegetables - Broccoli, Brussels Sprouts, [place], Carrots, Cauliflower, [place], Cucumber, Mushrooms, Peas, Radishes, Squash, Zucchini, White potatoes, Sweet potatoes & yams Poultry - Chicken, Eggs, [place], [employer] - Beef, Programmer, multimedia, Lamb Seafood - Shrimp, [place] fish, Salmon Grains - Oat, Rice Snacks - Pretzels, Popcorn  *Lissa Morales et al. Diet and its role in interstitial cystitis/bladder pain syndrome (IC/BPS) and comorbid conditions. [place] 2012 Jan 11.

## 2022-06-15 NOTE — Progress Notes (Signed)
[facility] New Patient Evaluation and Consultation  Referring Provider: Nat Math, MD PCP: Frederic Jericho, NP Date of Service: 06/15/2022  SUBJECTIVE Chief Complaint: New Patient (Initial Visit)  History of Present Illness: Terry Washington is a 61 y.o. Black or African-American female seen in consultation at the request of Dr. 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    Review of records significant for: Pt reports history of noting stool in rectum.   Urinary Symptoms: Leaks urine with with a full bladder, with urgency, and while asleep, Rarely leaks with cough/ sneeze.  Leaks 4 time(s) per day. Wakes up while urinating at night.  Pad use: 4 pads per day.  Adult diaper at night.  She is bothered by her UI symptoms. Has been on oxybutynin for OAB for about a year.   Day time voids 6.  Nocturia: 2 times per night to void. Voiding dysfunction: she empties her bladder well.  does not use a catheter to empty bladder.  When urinating, she feels she has no difficulties Drinks: 16 oz Green tea and 2-3 16oz bottles water per day She does drink up until bedtime.   UTIs: No  UTI's in the last year.   Denies history of blood in urine, kidney or bladder stones, pyelonephritis, bladder cancer, and kidney cancer  Pelvic Organ Prolapse Symptoms:                  She Admits to a feeling of a bulge the vaginal area. It has been present for 16 years.  She Admits to seeing a bulge.  This bulge is bothersome.  Reports she has tried a pessary but that did not work well and she stopped wearing the pessary.  Bowel Symptom: Bowel movements: 1 time(s) per day Stool consistency: soft  Straining: no.  Splinting: no.  Incomplete evacuation: yes.  She Denies accidental bowel leakage / fecal incontinence Bowel regimen:  not normally but has been on a stool softener following knee surgery.  Last colonoscopy: Never had one  She noted that she had stool  coming from the vagina after knee replacement (12/2021) when she was constipated. She may have seen something a few years ago as well but not sure. She wipes from the bottom up through the vagina. She was seen by her gynecologist and was told she had a "pinhole" in the rectum. Denies rectal bleeding. Notices this stool in vagina less when she eats cheese because her stool is more firm.   Sexual Function Sexually active: no.  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  Any history of abnormal pap smears: no.   Medications: She has a current medication list which includes the following prescription(s): vitamin c, b complex vitamins, bupropion, cholecalciferol, diclofenac sodium, etodolac, ginger (zingiber officinalis), ibuprofen, lisinopril, fish oil, turmeric, acetaminophen, and solifenacin.  Allergies: Patient is allergic to other.   Social History:  Social History   Tobacco Use   Smoking status: Never   Smokeless tobacco: Never  Vaping Use   Vaping Use: Never used  Substance Use Topics   Alcohol use: No   Drug use: No    Relationship status: divorced She lives with alone.   She is employed at [employer]. Regular exercise: No History of abuse: No  Family History:  History reviewed. No pertinent family history.   Review of Systems: Review of Systems  Constitutional:   Negative for fever, malaise/fatigue and weight loss.  Respiratory:  Negative for cough, shortness of breath and wheezing.   Cardiovascular:  Negative for chest pain, palpitations and leg swelling.  Gastrointestinal:  Negative for abdominal pain and blood in stool.  Genitourinary:  Negative for dysuria.  Musculoskeletal:  Negative for myalgias.  Skin:  Negative for rash.  Neurological:  Negative for dizziness and headaches.  Endo/Heme/Allergies:  Does not bruise/bleed easily.  Psychiatric/Behavioral:  Negative for depression. The patient is not nervous/anxious.      OBJECTIVE Physical Exam: Vitals:   06/15/22 1445  BP: (!) 147/72  Pulse: 88  Weight: 233 lb (105.7 kg)  Height: 5' (1.524 m)    Physical Exam Constitutional:      General: She is not in acute distress. Pulmonary:     Effort: Pulmonary effort is normal.  Abdominal:     General: There is no distension.     Palpations: Abdomen is soft.     Tenderness: There is no abdominal tenderness. There is no rebound.  Musculoskeletal:        General: No swelling. Normal range of motion.  Skin:    General: Skin is warm and dry.     Findings: No rash.  Neurological:     Mental Status: She is alert and oriented to person, place, and time.  Psychiatric:        Mood and Affect: Mood normal.        Behavior: Behavior normal.      GU / Detailed Urogynecologic Evaluation:  Pelvic Exam: Normal external female genitalia; Bartholin's and Skene's glands normal in appearance; urethral meatus normal in appearance, no urethral masses or discharge.   CST: negative  Speculum exam reveals normal vaginal mucosa with atrophy. Scar noted at introitus. Cervix normal appearance. Uterus normal single, nontender. Adnexa no mass, fullness, tenderness.     Pelvic floor strength I/V, puborectalis I/V external anal sphincter I/V  Pelvic floor musculature: Right levator non-tender, Right obturator non-tender, Left levator non-tender, Left  obturator non-tender  POP-Q:   POP-Q  0                                            Aa   0                                           Ba  1                                              C   4.5  Gh  3                                            Pb  7                                            tvl   -2.5                                            Ap  -2.5                                            Bp  -4                                              D      Rectal Exam:  Normal sphincter tone, small distal rectocele, enterocoele not present, no rectal masses.   With speculum placed in the vagina, rectal exam was performed with lubricant dyed with methylene blue. No extrusion of lubricant was noted through the vagina. Saline dyed with methylene blue was then injected into the rectum and rectum was noted to distend with no extrusion of dye through the rectum.   Post-Void Residual (PVR) by Bladder Scan: In order to evaluate bladder emptying, we discussed obtaining a postvoid residual and she agreed to this procedure.  Procedure: The ultrasound unit was placed on the patient's abdomen in the suprapubic region after the patient had voided. A PVR of 11 ml was obtained by bladder scan.  Laboratory Results: POC urine: large bilirubin, negative leukocytes and nitrites   ASSESSMENT AND PLAN Terry Washington is a 61 y.o. with:  1. Overactive bladder   2. Urinary frequency   3. Incontinence of feces, unspecified fecal incontinence type   4. Uterovaginal prolapse, incomplete   5. Prolapse of anterior vaginal wall    OAB - We discussed the symptoms of overactive bladder (OAB), which include urinary urgency, urinary frequency, nocturia, with or without urge incontinence.  While we do not know the exact etiology of OAB, several treatment options exist. We discussed management including behavioral therapy (decreasing bladder irritants, urge suppression  strategies, timed voids, bladder retraining), physical therapy, medication; for refractory cases posterior tibial nerve stimulation, sacral neuromodulation, and intravesical botulinum toxin injection.  - Stop oxybutynin. Prescribed vesicare 10mg  daily. For anticholinergic medications, we discussed the potential side effects of anticholinergics including dry eyes, dry mouth, constipation, cognitive impairment and urinary retention. - Also recommended decreasing green tea intake, especially in the evening.   2. Fecal incontinence.  - No fistula noted on exam- examined with methylene blue in rectum with no extrusion.  - Suspect that she may have fecal incontinence and is wiping back to front. Advised her to start psyllium fiber supplement daily for stool bulking to see if there is improvement. She should also only wipe front to back.  - Recommended she also get  a colonoscopy- she will contact her PCP.   3. Stage II anterior, Stage I posterior, Stage II apical prolapse - For treatment of pelvic organ prolapse, we discussed options for management including expectant management, conservative management, and surgical management, such as Kegels, a pessary, pelvic floor physical therapy, and specific surgical procedures. - She is currently dealing with MRSA infection after knee surgery so advised that she should wait until this resolves if she is interested in surgery.  - Also recommended weight loss prior to surgery as her obesity will place her at risk of complications and recurrence. If she does pursue surgery, would recommend vaginal repair.   Return 6 weeks   Marguerita Beards, MD   Medical Decision Making:  - Reviewed/ ordered a clinical laboratory test - Review and summation of prior records

## 2022-07-27 ENCOUNTER — Ambulatory Visit: Payer: BC Managed Care – PPO | Admitting: Obstetrics and Gynecology
